# Patient Record
Sex: Male | Born: 1950 | Race: White | Hispanic: No | Marital: Married | Smoking: Never smoker
Health system: Southern US, Community
[De-identification: ages and names within clinical notes are randomized; demographics above are authoritative.]

## PROBLEM LIST (undated history)

## (undated) DIAGNOSIS — Z9889 Other specified postprocedural states: Secondary | ICD-10-CM

## (undated) DIAGNOSIS — N529 Male erectile dysfunction, unspecified: Secondary | ICD-10-CM

## (undated) DIAGNOSIS — I4891 Unspecified atrial fibrillation: Secondary | ICD-10-CM

## (undated) HISTORY — DX: Other specified postprocedural states: Z98.890

## (undated) HISTORY — DX: Unspecified atrial fibrillation: I48.91

## (undated) HISTORY — DX: Male erectile dysfunction, unspecified: N52.9

---

## 2006-10-09 HISTORY — PX: COLONOSCOPY: SHX174

## 2011-06-09 DIAGNOSIS — Z9889 Other specified postprocedural states: Secondary | ICD-10-CM

## 2011-06-09 HISTORY — DX: Other specified postprocedural states: Z98.890

## 2014-12-09 ENCOUNTER — Ambulatory Visit: Payer: Self-pay | Admitting: Internal Medicine

## 2014-12-15 ENCOUNTER — Ambulatory Visit (INDEPENDENT_AMBULATORY_CARE_PROVIDER_SITE_OTHER): Payer: BC Managed Care – PPO | Admitting: Internal Medicine

## 2014-12-15 ENCOUNTER — Encounter: Payer: Self-pay | Admitting: Internal Medicine

## 2014-12-15 VITALS — BP 122/84 | HR 84 | Ht 76.0 in | Wt 204.4 lb

## 2014-12-15 DIAGNOSIS — I712 Thoracic aortic aneurysm, without rupture: Secondary | ICD-10-CM

## 2014-12-15 DIAGNOSIS — I482 Chronic atrial fibrillation, unspecified: Secondary | ICD-10-CM

## 2014-12-15 DIAGNOSIS — I480 Paroxysmal atrial fibrillation: Secondary | ICD-10-CM

## 2014-12-15 DIAGNOSIS — I4891 Unspecified atrial fibrillation: Secondary | ICD-10-CM | POA: Insufficient documentation

## 2014-12-15 DIAGNOSIS — I719 Aortic aneurysm of unspecified site, without rupture: Secondary | ICD-10-CM

## 2014-12-15 DIAGNOSIS — I119 Hypertensive heart disease without heart failure: Secondary | ICD-10-CM | POA: Diagnosis not present

## 2014-12-15 DIAGNOSIS — I7121 Aneurysm of the ascending aorta, without rupture: Secondary | ICD-10-CM

## 2014-12-15 NOTE — Patient Instructions (Addendum)
Medication Instructions:  Your physician recommends that you continue on your current medications as directed. Please refer to the Current Medication list given to you today.  Labwork: None ordered  Testing/Procedures: Your physician has requested that you have a cardiac MRI. Cardiac MRI uses a computer to create images of your heart as its beating, producing both still and moving pictures of your heart and major blood vessels. For further information please visit [email or web address]. Please follow the instruction sheet given to you today for more information.  Follow-Up: Your physician recommends that you schedule a follow-up appointment in: 8 weeks with Dr. Ladona Ridgelaylor.  Any Other Special Instructions Will Be Listed Below (If Applicable). We will obtain medical records from Dr. Geraldine ContrasNazim Khan at [facility] in [place], [place].  If you need a refill on your cardiac medications before your next appointment, please call your pharmacy.  Thank you for choosing [facility]!!

## 2014-12-15 NOTE — Assessment & Plan Note (Signed)
We discussed the treatment options with regard to thomboembolic prevention and rate vs rhythm control. Because he is mininimally if at all symptomatic and because of the difficulty in achieving rhythm control, I have recommended he continue his medications. If he does not have vascular disease on the MRI, then his CHADSVASC score would be 1 and we could even consider stopping Xarelto and switching to ASA, at least until he turns 66. I will see him back after he has undergone his Cardiac/Aorta MR

## 2014-12-15 NOTE — Progress Notes (Signed)
      HPI [name] is referred today for evaluation of atrial fibrillation. He is an otherwise well appearing 64 yo man with borderline HTN, who was found to have atrial fib back in September. He was placed on Xarelto and a beta blocker and underwent TEE where there was a question of LAA thrombus despite his being on Xarelto for a month and having a CHADSVASC score of 1. His dose of Toprol has been increased to 100 mg daily. He has been essentially asymptomatic. On his TEE, there was a question of aortic root dilation. His measurement was 4 cm. He is quite concerned about whether or not he has aneurysm. No h/o syncope.  No Known Allergies   Current Outpatient Prescriptions  Medication Sig Dispense Refill  . acetaminophen (TYLENOL) 500 MG tablet Take 500 mg by mouth every 6 (six) hours as needed (for pain).    Marland Kitchen. doxycycline (VIBRAMYCIN) 100 MG capsule Take 100 mg by mouth 2 (two) times daily as needed (for rosacea).    . loratadine (CLARITIN) 10 MG tablet Take 10 mg by mouth daily.    . metoprolol succinate (TOPROL-XL) 100 MG 24 hr tablet Take 100 mg by mouth daily.  3  . Omega-3 Fatty Acids (FISH OIL) 1200 MG CAPS Take 1 capsule by mouth daily.    . phenylephrine (SUDAFED PE) 10 MG TABS tablet Take 10 mg by mouth every 4 (four) hours as needed (sinus congestion).    . Red Yeast Rice 600 MG CAPS Take 1 capsule by mouth daily.    Carlena Hurl. XARELTO 20 MG TABS tablet Take 20 mg by mouth daily.  5   No current facility-administered medications for this visit.     Past Medical History  Diagnosis Date  . A-fib (HCC)     ROS:   All systems reviewed and negative except as noted in the HPI.   No past surgical history on file.   Family History  Problem Relation Age of Onset  . Diabetes Father   . Cancer Father      Social History   Social History  . Marital Status: Married    Spouse Name: N/A  . Number of Children: N/A  . Years of Education: N/A   Occupational History  . Not on  file.   Social History Main Topics  . Smoking status: Never Smoker   . Smokeless tobacco: Not on file  . Alcohol Use: 0.0 oz/week    0 Standard drinks or equivalent per week  . Drug Use: No  . Sexual Activity: Not on file   Other Topics Concern  . Not on file   Social History Narrative  . No narrative on file     BP 122/84 mmHg  Pulse 84  Ht 6\' 4"  (1.93 m)  Wt 204 lb 6.4 oz (92.715 kg)  BMI 24.89 kg/m2  Physical Exam:  Well appearing 64 yo man, NAD HEENT: Unremarkable Neck:  6 cm JVD, no thyromegally Lymphatics:  No adenopathy Back:  No CVA tenderness Lungs:  Clear HEART:  IRIR rhythm, no murmurs, no rubs, no clicks Abd:  soft, positive bowel sounds, no organomegally, no rebound, no guarding Ext:  2 plus pulses, no edema, no cyanosis, no clubbing Skin:  No rashes no nodules Neuro:  CN II through XII intact, motor grossly intact  EKG - atrial fib with a CVR  Assess/Plan:

## 2014-12-15 NOTE — Assessment & Plan Note (Signed)
His blood pressure is well controlled on medical therapy.

## 2014-12-15 NOTE — Assessment & Plan Note (Signed)
Will obtai nan MRI

## 2014-12-20 ENCOUNTER — Encounter: Payer: Self-pay | Admitting: Internal Medicine

## 2015-01-05 ENCOUNTER — Ambulatory Visit (HOSPITAL_COMMUNITY)
Admission: RE | Admit: 2015-01-05 | Discharge: 2015-01-05 | Disposition: A | Discharge status: Home / Self Care | Payer: BC Managed Care – PPO | Source: Ambulatory Visit | Attending: Internal Medicine | Admitting: Internal Medicine

## 2015-01-05 DIAGNOSIS — I712 Thoracic aortic aneurysm, without rupture: Secondary | ICD-10-CM

## 2015-01-05 DIAGNOSIS — I77810 Thoracic aortic ectasia: Secondary | ICD-10-CM | POA: Diagnosis not present

## 2015-01-05 DIAGNOSIS — I4891 Unspecified atrial fibrillation: Secondary | ICD-10-CM | POA: Diagnosis not present

## 2015-01-05 DIAGNOSIS — I7121 Aneurysm of the ascending aorta, without rupture: Secondary | ICD-10-CM

## 2015-01-05 DIAGNOSIS — I719 Aortic aneurysm of unspecified site, without rupture: Secondary | ICD-10-CM | POA: Insufficient documentation

## 2015-01-05 LAB — CREATININE, SERUM: Creatinine, Ser: 0.91 mg/dL (ref 0.61–1.24)

## 2015-01-05 MED ORDER — GADOBENATE DIMEGLUMINE 529 MG/ML IV SOLN
40.0000 mL | Freq: Once | INTRAVENOUS | Status: AC
  Administered 2015-01-05: 40 mL via INTRAVENOUS

## 2015-01-05 NOTE — Addendum Note (Signed)
Addended by: Lars MassonNELSON, Koree Staheli H on: 01/05/2015 12:02 PM   Modules accepted: Orders

## 2015-01-05 NOTE — Addendum Note (Signed)
Addended by: Lars MassonNELSON, Gabrial Poppell H on: 01/05/2015 11:52 AM   Modules accepted: Orders

## 2015-01-27 ENCOUNTER — Encounter: Payer: Self-pay | Admitting: Internal Medicine

## 2015-02-09 ENCOUNTER — Encounter: Payer: Self-pay | Admitting: Internal Medicine

## 2015-02-09 ENCOUNTER — Ambulatory Visit (INDEPENDENT_AMBULATORY_CARE_PROVIDER_SITE_OTHER): Payer: BC Managed Care – PPO | Admitting: Internal Medicine

## 2015-02-09 VITALS — BP 116/76 | HR 79 | Ht 76.0 in | Wt 207.4 lb

## 2015-02-09 DIAGNOSIS — I482 Chronic atrial fibrillation, unspecified: Secondary | ICD-10-CM

## 2015-02-09 NOTE — Progress Notes (Signed)
HPI Michael Sheppard returns today for evaluation of atrial fibrillation and an aortic aneurysm. He is an otherwise well appearing 65 yo man with borderline HTN, who was found to have atrial fib back in September. He was placed on Xarelto and a beta blocker and underwent TEE where there was a question of LAA thrombus despite his being on Xarelto for a month and having a CHADSVASC score of 1. His dose of Toprol has been increased to 100 mg daily. He has been essentially asymptomatic. On his TEE, there was a question of aortic root dilation. When I saw him we recommended a cardiac MRI with MRI of the thoracic aorta. He was found to have an aortic root diameter of 47 mm and an ascending aorta of 40 mm. His LV function was normal. The recommendation was to repeat the scan in a year.  No Known Allergies   Current Outpatient Prescriptions  Medication Sig Dispense Refill  . acetaminophen (TYLENOL) 500 MG tablet Take 500 mg by mouth every 6 (six) hours as needed (for pain).    Marland Kitchen doxycycline (VIBRAMYCIN) 100 MG capsule Take 100 mg by mouth 2 (two) times daily as needed (for rosacea).    . loratadine (CLARITIN) 10 MG tablet Take 10 mg by mouth daily.    . metoprolol succinate (TOPROL-XL) 100 MG 24 hr tablet Take 100 mg by mouth daily.  3  . Omega-3 Fatty Acids (FISH OIL) 1200 MG CAPS Take 1 capsule by mouth daily.    . phenylephrine (SUDAFED PE) 10 MG TABS tablet Take 10 mg by mouth every 4 (four) hours as needed (sinus congestion).    . Red Yeast Rice 600 MG CAPS Take 1 capsule by mouth daily.    . tadalafil (CIALIS) 5 MG tablet Take 5 mg by mouth daily as needed for erectile dysfunction.    Carlena Hurl 20 MG TABS tablet Take 20 mg by mouth daily.  5   No current facility-administered medications for this visit.     Past Medical History  Diagnosis Date  . A-fib (HCC)   . Erectile dysfunction   . H/O colonoscopy 06/2011    ROS:   All systems reviewed and negative except as noted in the  HPI.   Past Surgical History  Procedure Laterality Date  . Colonoscopy  10/2006    POLYP     Family History  Problem Relation Age of Onset  . Diabetes Father   . Cancer Father      Social History   Social History  . Marital Status: Married    Spouse Name: N/A  . Number of Children: N/A  . Years of Education: N/A   Occupational History  . Not on file.   Social History Main Topics  . Smoking status: Never Smoker   . Smokeless tobacco: Not on file  . Alcohol Use: 0.0 oz/week    0 Standard drinks or equivalent per week  . Drug Use: No  . Sexual Activity: Not on file   Other Topics Concern  . Not on file   Social History Narrative     BP 116/76 mmHg  Pulse 79  Ht  (1.93 m)  Wt 207 lb 6.4 oz (94.076 kg)  BMI 25.26 kg/m2  Physical Exam:  Well appearing 65 yo man, NAD HEENT: Unremarkable Neck:  6 cm JVD, no thyromegally Lymphatics:  No adenopathy Back:  No CVA tenderness Lungs:  Clear with no wheezes HEART:  IRIR rhythm, no murmurs,  no rubs, no clicks Abd:  soft, positive bowel sounds, no organomegally, no rebound, no guarding Ext:  2 plus pulses, no edema, no cyanosis, no clubbing Skin:  No rashes no nodules Neuro:  CN II through XII intact, motor grossly intact  EKG - atrial fib with a CVR  Assess/Plan: 1. Atrial fib - his rate is well controlled. With vascular disease and HTN, will continue systemic anti-coagulation 2. Aortic aneurysm - measurements have been documented. He will undergo watchful waiting and we will repeat his scan in a year. He is instructed to call if he has chest pain. 3. HTN - his blood pressure is well controlled. Will follow.  4. Palpitations - he is mostly asymptomatic although he noted some palpitations when he was in the scanner.

## 2015-02-09 NOTE — Patient Instructions (Signed)

## 2015-12-28 ENCOUNTER — Encounter: Payer: Self-pay | Admitting: Internal Medicine

## 2016-02-08 ENCOUNTER — Encounter: Payer: Self-pay | Admitting: *Deleted

## 2016-02-13 ENCOUNTER — Ambulatory Visit (INDEPENDENT_AMBULATORY_CARE_PROVIDER_SITE_OTHER): Payer: BC Managed Care – PPO | Admitting: Internal Medicine

## 2016-02-13 ENCOUNTER — Encounter: Payer: Self-pay | Admitting: Internal Medicine

## 2016-02-13 VITALS — BP 120/84 | HR 74 | Ht 76.0 in | Wt 208.4 lb

## 2016-02-13 DIAGNOSIS — I7121 Aneurysm of the ascending aorta, without rupture: Secondary | ICD-10-CM

## 2016-02-13 DIAGNOSIS — I482 Chronic atrial fibrillation, unspecified: Secondary | ICD-10-CM

## 2016-02-13 DIAGNOSIS — I712 Thoracic aortic aneurysm, without rupture: Secondary | ICD-10-CM | POA: Diagnosis not present

## 2016-02-13 NOTE — Patient Instructions (Addendum)
Medication Instructions:  Your physician recommends that you continue on your current medications as directed. Please refer to the Current Medication list given to you today.   Labwork: None Ordered   Testing/Procedures: MRA    Follow-Up: Your physician wants you to follow-up in: 1 year with Dr. Ladona Ridgelaylor. You will receive a reminder letter in the mail two months in advance. If you don't receive a letter, please call our office to schedule the follow-up appointment.     Any Other Special Instructions Will Be Listed Below (If Applicable).     If you need a refill on your cardiac medications before your next appointment, please call your pharmacy.

## 2016-02-13 NOTE — Progress Notes (Signed)
HPI [name] returns today for ongoing evaluation of atrial fibrillation and an aortic aneurysm. He is an otherwise well appearing 66 yo man with borderline HTN, who was found to have atrial fib back in September. He was placed on Xarelto and a beta blocker and underwent TEE where there was a question of LAA thrombus despite his being on Xarelto for a month and having a CHADSVASC score of 1. His dose of Toprol has been increased to 100 mg daily. He has been essentially asymptomatic. On his TEE, there was a question of aortic root dilation. When I saw him we recommended a cardiac MRI with MRI of the thoracic aorta. He was found to have an aortic root diameter of 47 mm and an ascending aorta of 40 mm. His LV function was normal. The recommendation was to repeat the scan in a year. In the interim, he has done well. He denies chest pain.  No Known Allergies   Current Outpatient Prescriptions  Medication Sig Dispense Refill  . acetaminophen (TYLENOL) 500 MG tablet Take 500 mg by mouth every 6 (six) hours as needed (for pain).    Marland Kitchen. doxycycline (VIBRAMYCIN) 100 MG capsule Take 100 mg by mouth 2 (two) times daily as needed (for rosacea).    . loratadine (CLARITIN) 10 MG tablet Take 10 mg by mouth daily.    . metoprolol succinate (TOPROL-XL) 100 MG 24 hr tablet Take 100 mg by mouth daily.  3  . Omega-3 Fatty Acids (FISH OIL) 1200 MG CAPS Take 1 capsule by mouth daily.    . phenylephrine (SUDAFED PE) 10 MG TABS tablet Take 10 mg by mouth every 4 (four) hours as needed (sinus congestion).    . Red Yeast Rice 600 MG CAPS Take 1 capsule by mouth daily.    . tadalafil (CIALIS) 5 MG tablet Take 5 mg by mouth daily as needed for erectile dysfunction.    Michael Sheppard. XARELTO 20 MG TABS tablet Take 20 mg by mouth daily.  5   No current facility-administered medications for this visit.      Past Medical History:  Diagnosis Date  . A-fib (HCC)   . Erectile dysfunction   . H/O colonoscopy 06/2011    ROS:   All systems reviewed and negative except as noted in the HPI.   Past Surgical History:  Procedure Laterality Date  . COLONOSCOPY  10/2006   POLYP     Family History  Problem Relation Age of Onset  . Diabetes Father   . Cancer Father      Social History   Social History  . Marital status: Married    Spouse name: N/A  . Number of children: N/A  . Years of education: N/A   Occupational History  . Not on file.   Social History Main Topics  . Smoking status: Never Smoker  . Smokeless tobacco: Never Used  . Alcohol use 0.0 oz/week  . Drug use: No  . Sexual activity: Not on file   Other Topics Concern  . Not on file   Social History Narrative  . No narrative on file     BP 120/84   Pulse 74   Ht 6\' 4"  (1.93 m)   Wt 208 lb 6.4 oz (94.5 kg)   SpO2 98%   BMI 25.37 kg/m   Physical Exam:  Well appearing 66 yo man, NAD HEENT: Unremarkable Neck:  6 cm JVD, no thyromegally Lymphatics:  No adenopathy Back:  No CVA  tenderness Lungs:  Clear with no wheezes HEART:  IRIR rhythm, no murmurs, no rubs, no clicks Abd:  soft, positive bowel sounds, no organomegally, no rebound, no guarding Ext:  2 plus pulses, no edema, no cyanosis, no clubbing Skin:  No rashes no nodules Neuro:  CN II through XII intact, motor grossly intact  EKG - atrial fib with a CVR  Assess/Plan: 1. Atrial fib - his rate is well controlled. With vascular disease and HTN, will continue systemic anti-coagulation 2. Aortic aneurysm - will recheck his MRI of the chest to look at his aortic root.  He is instructed to call if he has chest pain. 3. HTN - his blood pressure is well controlled. Will follow.  4. coags - he will continue his xarelto  Michael Sheppard.D.

## 2016-02-16 ENCOUNTER — Encounter: Payer: Self-pay | Admitting: Internal Medicine

## 2016-02-21 ENCOUNTER — Ambulatory Visit (HOSPITAL_COMMUNITY)
Admission: RE | Admit: 2016-02-21 | Discharge: 2016-02-21 | Disposition: A | Discharge status: Home / Self Care | Payer: BC Managed Care – PPO | Source: Ambulatory Visit | Attending: Internal Medicine | Admitting: Internal Medicine

## 2016-02-21 DIAGNOSIS — K7689 Other specified diseases of liver: Secondary | ICD-10-CM | POA: Insufficient documentation

## 2016-02-21 DIAGNOSIS — I712 Thoracic aortic aneurysm, without rupture: Secondary | ICD-10-CM | POA: Insufficient documentation

## 2016-02-21 DIAGNOSIS — N281 Cyst of kidney, acquired: Secondary | ICD-10-CM | POA: Insufficient documentation

## 2016-02-21 DIAGNOSIS — I7121 Aneurysm of the ascending aorta, without rupture: Secondary | ICD-10-CM

## 2016-02-21 LAB — CREATININE, SERUM
Creatinine, Ser: 0.91 mg/dL (ref 0.61–1.24)
GFR calc Af Amer: >60 mL/min (ref >60)
GFR calc non Af Amer: >60 mL/min (ref >60)

## 2016-02-21 MED ORDER — GADOBENATE DIMEGLUMINE 529 MG/ML IV SOLN
20.0000 mL | Freq: Once | INTRAVENOUS | Status: AC | PRN
  Administered 2016-02-21: 20 mL via INTRAVENOUS

## 2016-07-23 IMAGING — MR MR MRA CHEST W/ OR W/O CM
14 series · 16 of 16 positions shown · IV contrast (Yes   MULTIHANCE)
Comparison: none

CLINICAL DATA: 64-year-old male with atrial fibrillation and
ascending aortic aneurysm.

EXAM:
CARDIAC MRI
TECHNIQUE: The patient was scanned on a 1.5 Tesla GE magnet. A dedicated
cardiac coil was used. Functional imaging was done using Fiesta
sequences. [DATE], and 4 chamber views were done to assess for RWMA's.
Modified Dessai rule using a short axis stack was used to
calculate an ejection fraction on a dedicated work station using
Circle software. The patient received 40 cc of Multihance. After 10
minutes inversion recovery sequences were used to assess for
infiltration and scar tissue.
CONTRAST:  40 cc  of Multihance

[Series 3: bSSFP · sagittal · 8.0mm · 1.45mm/px · 1 of 14 slices shown (1 of 4)]
[im 1/14]
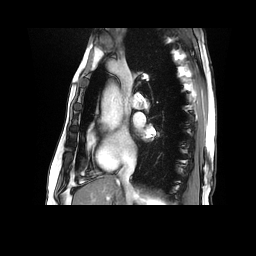

[Series 4: bSSFP · oblique · 8.0mm · 1.64mm/px · 1 of 20 slices shown (2 of 4)]
[im 1/20]
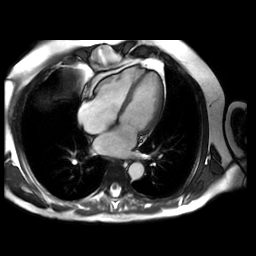

[Series 5: bSSFP · oblique · 8.0mm · 1.68mm/px · 3 of 340 slices shown (3 of 4)]
[im 1/340]
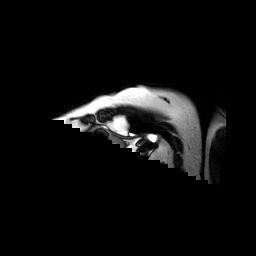
[im 170/340]
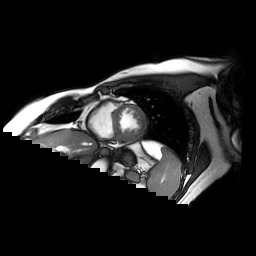
[im 340/340]
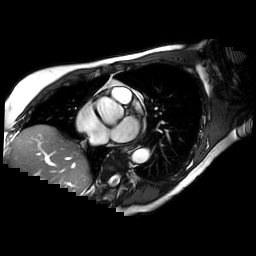

[Series 6: T1 · axial · 8.0mm · 1.56mm/px · 1 of 23 slices shown]
[im 1/23]
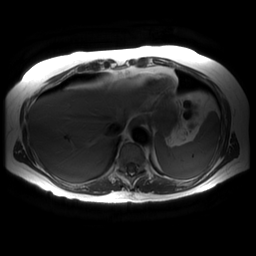

[Series 7: bSSFP · oblique · 8.0mm · 0.82mm/px · 1 of 220 slices shown (4 of 4)]
[im 1/220]
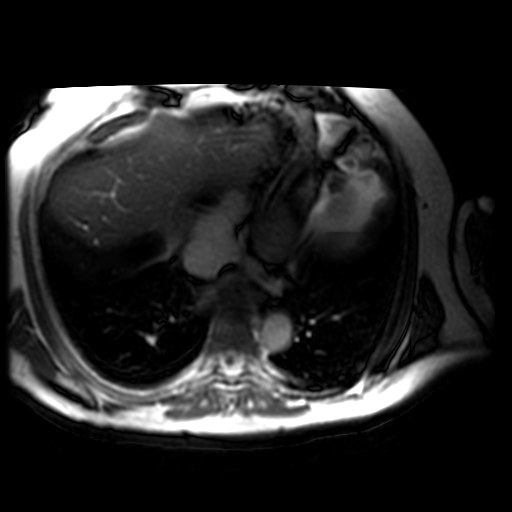

[Series 10: T2 · oblique · 8.0mm · 1.68mm/px · 1 of 60 slices shown]
[im 1/60]
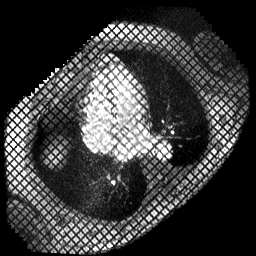

[Series 19: rad delayed ir · oblique · 8.0mm · 1.72mm/px · 1 of 3 slices shown (1 of 2)]
[im 1/3]
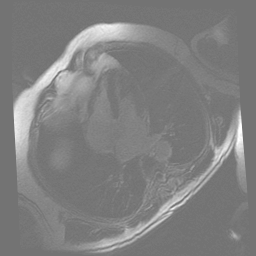

[Series 21: rad delayed ir · oblique · 8.0mm · 1.72mm/px · 1 of 4 slices shown (2 of 2)]
[im 1/4]
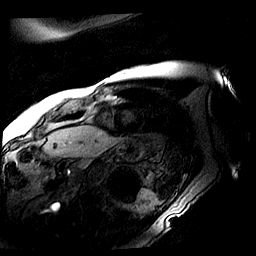

[Series 800: MRA · sagittal · 3.0mm · 0.74mm/px · 1 of 64 slices shown (1 of 3)]
[im 1/64]
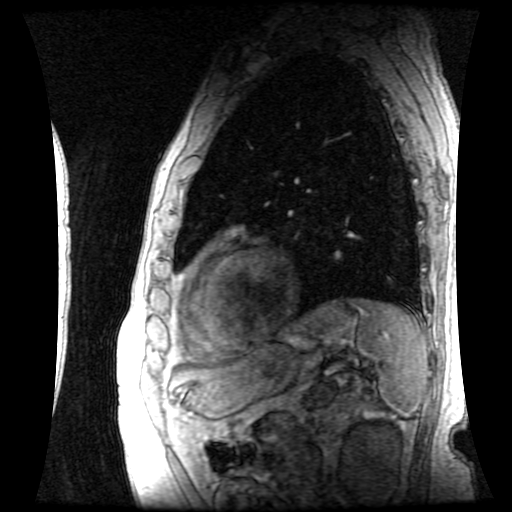

[Series 801: MRA · sagittal · 3.0mm · 0.74mm/px · 1 of 64 slices shown (2 of 3)]
[im 1/64]
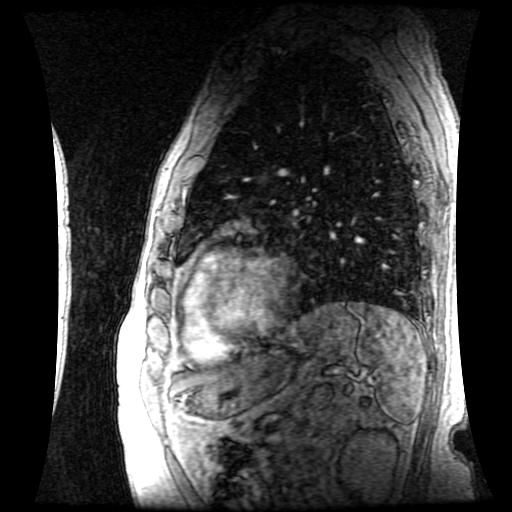

[Series 802: MRA · sagittal · 3.0mm · 0.74mm/px · 1 of 64 slices shown (3 of 3)]
[im 1/64]
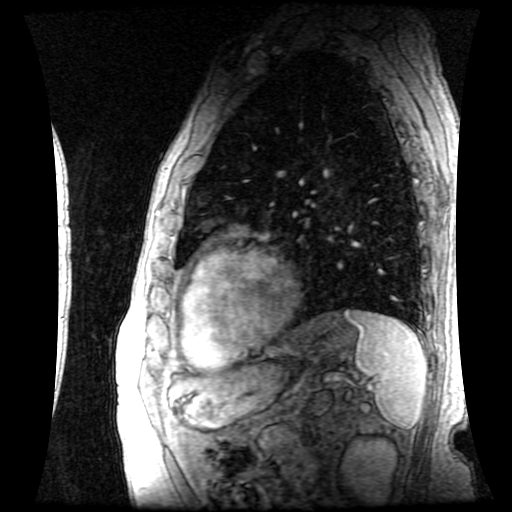

[((id)/801/1)-((id)/800/1) · sagittal · 3.0mm · 0.74mm/px · 1 of 64 slices shown]
[im 1/64]
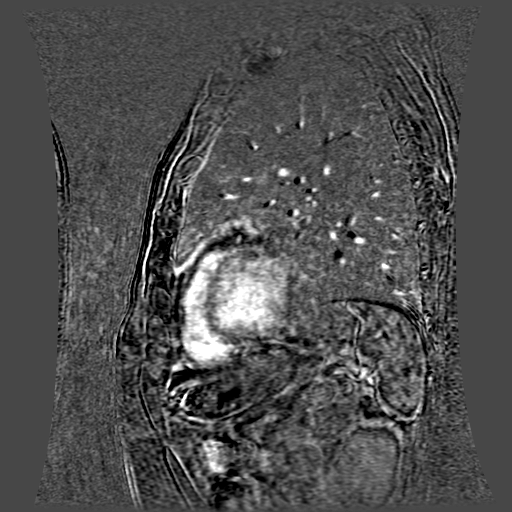

[((id)/802/1)-((id)/800/1) · sagittal · 3.0mm · 0.74mm/px · 1 of 64 slices shown]
[im 1/64]
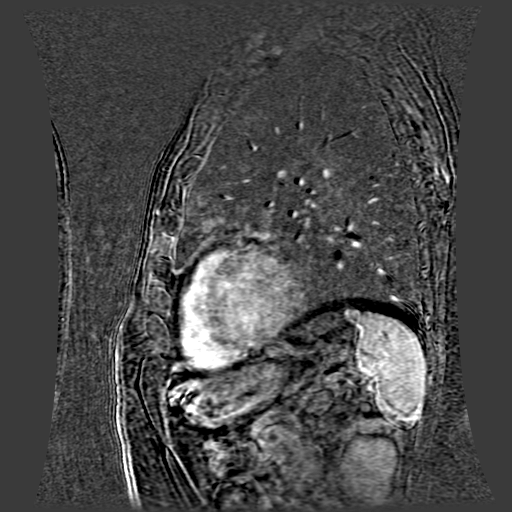

[processed images · sagittal · 3.0mm · 0.74mm/px · 1 of 5 slices shown]
[im 1/5]
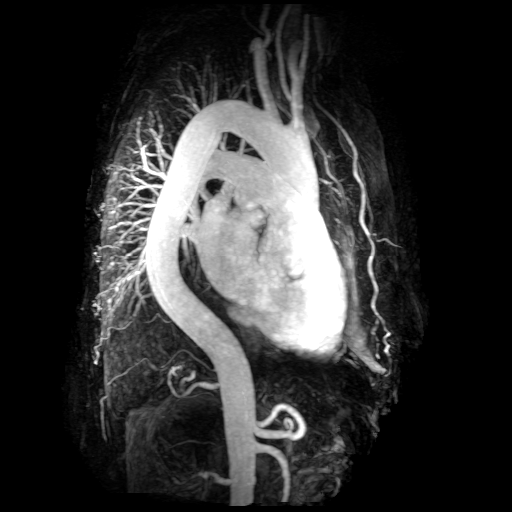

[16 of 16 positions shown; findings below may reference images not displayed]

FINDINGS: 1. Normal left ventricular size, thickness and systolic function
(LVEF = 55%) with no regional wall motion abnormalities.

There is no late gadolinium enhancement in the LV myocardium.

LVEDD:  46 mm

LVESD:  34 mm

LVEDV:  151 ml

LVESV:  68 ml

SV:  83 ml

CO:  6.3 L/minute

2. Normal right ventricular size, thickness and systolic function
(LVEF = 51%) with no regional wall motion abnormalities.

RVEDV:  163 ml

RVESV:  81 ml

SV:  83 ml

CO:  6.3 L/minute

3.  Mild left and right atrial dilatation.

4. Mild mitral and trivial tricuspid regurgitation. Trivial aortic
regurgitation.

5.  Aortic root is dilated with maximum diameter 47 mm.

6. Ascending aorta is aneurysmal measuring 40 mm. Aortic arch and
descending aorta have normal size.
IMPRESSION: 1. Normal left ventricular size, thickness and systolic function
(LVEF = 55%) with no regional wall motion abnormalities.

2. Normal right ventricular size, thickness and systolic function
(LVEF = 51%) with no regional wall motion abnormalities.

3.  Mild left and right atrial dilatation.

4. Mild mitral and trivial tricuspid regurgitation. Trivial aortic
regurgitation.

5. Dilated aortic root with maximum diameter 47 mm.

6. Ascending aorta is aneurysmal measuring 40 mm. Repeat CTA/MRA in
1 year.

Veni Ronnie

## 2017-02-20 ENCOUNTER — Ambulatory Visit: Payer: Medicare Other | Admitting: Internal Medicine

## 2017-02-20 ENCOUNTER — Encounter: Payer: Self-pay | Admitting: Internal Medicine

## 2017-02-20 VITALS — BP 108/74 | HR 86 | Ht 76.0 in | Wt 197.0 lb

## 2017-02-20 DIAGNOSIS — I7121 Aneurysm of the ascending aorta, without rupture: Secondary | ICD-10-CM

## 2017-02-20 DIAGNOSIS — I482 Chronic atrial fibrillation, unspecified: Secondary | ICD-10-CM

## 2017-02-20 DIAGNOSIS — I712 Thoracic aortic aneurysm, without rupture, unspecified: Secondary | ICD-10-CM

## 2017-02-20 NOTE — Patient Instructions (Addendum)
Medication Instructions:  Your physician recommends that you continue on your current medications as directed. Please refer to the Current Medication list given to you today.  Labwork: None ordered.  Testing/Procedures: You will get a MRA of your chest.    Follow-Up: Your physician wants you to follow-up in: one year with Dr. Ladona Ridgelaylor.   You will receive a reminder letter in the mail two months in advance. If you don't receive a letter, please call our office to schedule the follow-up appointment.  Any Other Special Instructions Will Be Listed Below (If Applicable).  If you need a refill on your cardiac medications before your next appointment, please call your pharmacy.

## 2017-02-20 NOTE — Progress Notes (Signed)
HPI Michael Sheppard returns today for ongoing evaluation of atrial fibrillation and an aortic aneurysm. He is an otherwise well appearing 67 yo man with borderline HTN, who was found to have atrial fib over a year ago. He was placed on Xarelto and a beta blocker and underwent TEE where there was a question of LAA thrombus despite his being on Xarelto for a month and having a CHADSVASC score of 1. His dose of Toprol has been increased to 100 mg daily. He has been essentially asymptomatic. On his TEE, there was a question of aortic root dilation. When I saw him we recommended a cardiac MRI with MRI of the thoracic aorta. He was found to have an aortic root diameter of 47 mm and an ascending aorta of 40 mm. His LV function was normal.  In the interim, he has done well. He denies chest pain. He has rare palpitations when he lies down to sleep at night.  No Known Allergies   Current Outpatient Medications  Medication Sig Dispense Refill  . acetaminophen (TYLENOL) 500 MG tablet Take 500 mg by mouth every 6 (six) hours as needed (for pain).    Marland Kitchen doxycycline (VIBRAMYCIN) 100 MG capsule Take 100 mg by mouth 2 (two) times daily as needed (for rosacea).    . loratadine (CLARITIN) 10 MG tablet Take 10 mg by mouth daily.    . metoprolol succinate (TOPROL-XL) 100 MG 24 hr tablet Take 100 mg by mouth daily.  3  . Omega-3 Fatty Acids (FISH OIL) 1200 MG CAPS Take 1 capsule by mouth daily.    . phenylephrine (SUDAFED PE) 10 MG TABS tablet Take 10 mg by mouth every 4 (four) hours as needed (sinus congestion).    . Red Yeast Rice 600 MG CAPS Take 1 capsule by mouth daily.    . tadalafil (CIALIS) 5 MG tablet Take 5 mg by mouth daily as needed for erectile dysfunction.    . rivaroxaban (XARELTO) 20 MG TABS tablet Xarelto 20 mg tablet  TK 1 T PO ONCE D WF     No current facility-administered medications for this visit.      Past Medical History:  Diagnosis Date  . A-fib (HCC)   . Erectile dysfunction   . H/O  colonoscopy 06/2011    ROS:   All systems reviewed and negative except as noted in the HPI.   Past Surgical History:  Procedure Laterality Date  . COLONOSCOPY  10/2006   POLYP     Family History  Problem Relation Age of Onset  . Diabetes Father   . Cancer Father      Social History   Socioeconomic History  . Marital status: Married    Spouse name: Not on file  . Number of children: Not on file  . Years of education: Not on file  . Highest education level: Not on file  Social Needs  . Financial resource strain: Not on file  . Food insecurity - worry: Not on file  . Food insecurity - inability: Not on file  . Transportation needs - medical: Not on file  . Transportation needs - non-medical: Not on file  Occupational History  . Not on file  Tobacco Use  . Smoking status: Never Smoker  . Smokeless tobacco: Never Used  Substance and Sexual Activity  . Alcohol use: Yes    Alcohol/week: 0.0 oz  . Drug use: No  . Sexual activity: Not on file  Other Topics Concern  .  Not on file  Social History Narrative  . Not on file     BP 108/74   Pulse 86   Ht 6\' 4"  (1.93 m)   Wt 197 lb (89.4 kg)   BMI 23.98 kg/m   Physical Exam:  Well appearing 67 yo man, NAD HEENT: Unremarkable Neck:  No JVD, no thyromegally Lymphatics:  No adenopathy Back:  No CVA tenderness Lungs:  Clear HEART:  IRegular rate rhythm, no murmurs, no rubs, no clicks Abd:  soft, positive bowel sounds, no organomegally, no rebound, no guarding Ext:  2 plus pulses, no edema, no cyanosis, no clubbing Skin:  No rashes no nodules Neuro:  CN II through XII intact, motor grossly intact  EKG - atrial fib with a controlled VR   Assess/Plan: 1. Persistent atrial fib - we again discussed rate vs rhythm control. He would like to continue with rate control. 2. HTN - his blood pressure is well controlled. Will follow 3. coags - he will continue Xarelto. No change in meds. 4. Ascending aneurysm - we will  schedule repeat MRI scan over the course of the next week or two. Surgical consult if/when over 5 cm.  Leonia ReevesGregg Newman Waren,M.D.

## 2017-03-04 ENCOUNTER — Ambulatory Visit (HOSPITAL_COMMUNITY)
Admission: RE | Admit: 2017-03-04 | Discharge: 2017-03-04 | Disposition: A | Discharge status: Home / Self Care | Payer: Medicare Other | Source: Ambulatory Visit | Attending: Internal Medicine | Admitting: Internal Medicine

## 2017-03-04 DIAGNOSIS — I712 Thoracic aortic aneurysm, without rupture, unspecified: Secondary | ICD-10-CM

## 2017-03-04 LAB — POCT I-STAT CREATININE: Creatinine, Ser: 0.8 mg/dL (ref 0.61–1.24)

## 2017-03-04 MED ORDER — GADOBENATE DIMEGLUMINE 529 MG/ML IV SOLN
20.0000 mL | Freq: Once | INTRAVENOUS | Status: AC
  Administered 2017-03-04: 20 mL via INTRAVENOUS

## 2018-02-26 ENCOUNTER — Encounter: Payer: Self-pay | Admitting: Internal Medicine

## 2018-02-26 ENCOUNTER — Ambulatory Visit: Payer: Medicare Other | Admitting: Internal Medicine

## 2018-02-26 VITALS — BP 124/70 | HR 82 | Ht 76.0 in | Wt 200.0 lb

## 2018-02-26 DIAGNOSIS — I10 Essential (primary) hypertension: Secondary | ICD-10-CM

## 2018-02-26 DIAGNOSIS — I7121 Aneurysm of the ascending aorta, without rupture: Secondary | ICD-10-CM

## 2018-02-26 DIAGNOSIS — I712 Thoracic aortic aneurysm, without rupture, unspecified: Secondary | ICD-10-CM

## 2018-02-26 DIAGNOSIS — I482 Chronic atrial fibrillation, unspecified: Secondary | ICD-10-CM | POA: Diagnosis not present

## 2018-02-26 MED ORDER — ROSUVASTATIN CALCIUM 10 MG PO TABS: 10.0000 mg | ORAL_TABLET | Freq: Every day | ORAL | 3 refills | Status: DC

## 2018-02-26 NOTE — Progress Notes (Signed)
HPI Michael Sheppard returns today for ongoing evaluation and management of his atrial fib and thoracic aneurysm. He has an ascending aortic aneurysm which has been stable. He has had longstanding persistent atrial fib. He has never had syncope. He has been maintained on systemic anti-coagulation. He denies palpitations or sob or other symptoms associated with heart.  No Known Allergies   Current Outpatient Medications  Medication Sig Dispense Refill  . acetaminophen (TYLENOL) 500 MG tablet Take 500 mg by mouth every 6 (six) hours as needed (for pain).    Marland Kitchen doxycycline (VIBRAMYCIN) 100 MG capsule Take 100 mg by mouth 2 (two) times daily as needed (for rosacea).    . loratadine (CLARITIN) 10 MG tablet Take 10 mg by mouth daily.    . metoprolol succinate (TOPROL-XL) 100 MG 24 hr tablet Take 100 mg by mouth daily.  3  . Omega-3 Fatty Acids (FISH OIL) 1200 MG CAPS Take 1 capsule by mouth daily.    . phenylephrine (SUDAFED PE) 10 MG TABS tablet Take 10 mg by mouth every 4 (four) hours as needed (sinus congestion).    . Red Yeast Rice 600 MG CAPS Take 1 capsule by mouth daily.    . rivaroxaban (XARELTO) 20 MG TABS tablet Xarelto 20 mg tablet  TK 1 T PO ONCE D WF    . tadalafil (CIALIS) 5 MG tablet Take 5 mg by mouth daily as needed for erectile dysfunction.    . rosuvastatin (CRESTOR) 10 MG tablet Take 1 tablet (10 mg total) by mouth daily. 90 tablet 3   No current facility-administered medications for this visit.      Past Medical History:  Diagnosis Date  . A-fib (HCC)   . Erectile dysfunction   . H/O colonoscopy 06/2011    ROS:   All systems reviewed and negative except as noted in the HPI.   Past Surgical History:  Procedure Laterality Date  . COLONOSCOPY  10/2006   POLYP     Family History  Problem Relation Age of Onset  . Diabetes Father   . Cancer Father      Social History   Socioeconomic History  . Marital status: Married    Spouse name: Not on file  . Number  of children: Not on file  . Years of education: Not on file  . Highest education level: Not on file  Occupational History  . Not on file  Social Needs  . Financial resource strain: Not on file  . Food insecurity:    Worry: Not on file    Inability: Not on file  . Transportation needs:    Medical: Not on file    Non-medical: Not on file  Tobacco Use  . Smoking status: Never Smoker  . Smokeless tobacco: Never Used  Substance and Sexual Activity  . Alcohol use: Yes    Alcohol/week: 0.0 standard drinks  . Drug use: No  . Sexual activity: Not on file  Lifestyle  . Physical activity:    Days per week: Not on file    Minutes per session: Not on file  . Stress: Not on file  Relationships  . Social connections:    Talks on phone: Not on file    Gets together: Not on file    Attends religious service: Not on file    Active member of club or organization: Not on file    Attends meetings of clubs or organizations: Not on file    Relationship status:  Not on file  . Intimate partner violence:    Fear of current or ex partner: Not on file    Emotionally abused: Not on file    Physically abused: Not on file    Forced sexual activity: Not on file  Other Topics Concern  . Not on file  Social History Narrative  . Not on file     BP 124/70   Pulse 82   Ht 6\' 4"  (1.93 m)   Wt 200 lb (90.7 kg)   BMI 24.34 kg/m   Physical Exam:  Well appearing NAD HEENT: Unremarkable Neck:  No JVD, no thyromegally Lymphatics:  No adenopathy Back:  No CVA tenderness Lungs:  Clear with no wheezes HEART:  IRegular rate rhythm, no murmurs, no rubs, no clicks Abd:  soft, positive bowel sounds, no organomegally, no rebound, no guarding Ext:  2 plus pulses, no edema, no cyanosis, no clubbing Skin:  No rashes no nodules Neuro:  CN II through XII intact, motor grossly intact  EKG - atrial fib with a controlled VR  DEVICE  Normal device function.  See PaceArt for details.   Assess/Plan: 1.  Atrial fib - his rates are well controlled. He will continue his current meds. 2. HTN - his blood pressure is controlled. No change in his meds. 3. Aortic aneurysm - he is asymptomatic. He will undergo yearly CT of the chest.  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  Leonia Reeves.D.

## 2018-02-26 NOTE — Patient Instructions (Addendum)
Medication Instructions:  Your physician has recommended you make the following change in your medication:   1.  Start taking Crestor 10 mg-  Take one tablet by mouth daily   Labwork: None ordered.  Testing/Procedures: Please schedule for an MR angiogram chest at [facility].   Follow-Up: Your physician wants you to follow-up in: one year with Dr. Ladona Ridgel.   You will receive a reminder letter in the mail two months in advance. If you don't receive a letter, please call our office to schedule the follow-up appointment.  Any Other Special Instructions Will Be Listed Below (If Applicable).  If you need a refill on your cardiac medications before your next appointment, please call your pharmacy.    Rosuvastatin Tablets What is this medicine? ROSUVASTATIN (roe SOO va sta tin) is known as a HMG-CoA reductase inhibitor or 'statin'. It lowers cholesterol and triglycerides in the blood. This drug may also reduce the risk of heart attack, stroke, or other health problems in patients with risk factors for heart disease. Diet and lifestyle changes are often used with this drug. This medicine may be used for other purposes; ask your health care provider or pharmacist if you have questions. COMMON BRAND NAME(S): Crestor What should I tell my health care provider before I take this medicine? They need to know if you have any of these conditions: -diabetes -if you often drink alcohol -history of stroke -kidney disease -liver disease -muscle aches or weakness -thyroid disease -an unusual or allergic reaction to rosuvastatin, other medicines, foods, dyes, or preservatives -pregnant or trying to get pregnant -breast-feeding How should I use this medicine? Take this medicine by mouth with a glass of water. Follow the directions on the prescription label. Do not cut, crush or chew this medicine. You can take this medicine with or without food. Take your doses at regular intervals. Do not  take your medicine more often than directed. Talk to your pediatrician regarding the use of this medicine in children. While this drug may be prescribed for children as young as 37 years old for selected conditions, precautions do apply. Overdosage: If you think you have taken too much of this medicine contact a poison control center or emergency room at once. NOTE: This medicine is only for you. Do not share this medicine with others. What if I miss a dose? If you miss a dose, take it as soon as you can. If your next dose is to be taken in less than 12 hours, then do not take the missed dose. Take the next dose at your regular time. Do not take double or extra doses. What may interact with this medicine? Do not take this medicine with any of the following medications: -herbal medicines like red yeast rice This medicine may also interact with the following medications: -alcohol -antacids containing aluminum hydroxide or magnesium hydroxide -cyclosporine -other medicines for high cholesterol -some medicines for HIV infection -warfarin This list may not describe all possible interactions. Give your health care provider a list of all the medicines, herbs, non-prescription drugs, or dietary supplements you use. Also tell them if you smoke, drink alcohol, or use illegal drugs. Some items may interact with your medicine. What should I watch for while using this medicine? Visit your doctor or health care professional for regular check-ups. You may need regular tests to make sure your liver is working properly. Your health care professional may tell you to stop taking this medicine if you develop muscle problems. If your  muscle problems do not go away after stopping this medicine, contact your health care professional. Do not become pregnant while taking this medicine. Women should inform their health care professional if they wish to become pregnant or think they might be pregnant. There is a potential  for serious side effects to an unborn child. Talk to your health care professional or pharmacist for more information. Do not breast-feed an infant while taking this medicine. This medicine may affect blood sugar levels. If you have diabetes, check with your doctor or health care professional before you change your diet or the dose of your diabetic medicine. If you are going to need surgery or other procedure, tell your doctor that you are using this medicine. This drug is only part of a total heart-health program. Your doctor or a dietician can suggest a low-cholesterol and low-fat diet to help. Avoid alcohol and smoking, and keep a proper exercise schedule. This medicine may cause a decrease in Co-Enzyme Q-10. You should make sure that you get enough Co-Enzyme Q-10 while you are taking this medicine. Discuss the foods you eat and the vitamins you take with your health care professional. What side effects may I notice from receiving this medicine? Side effects that you should report to your doctor or health care professional as soon as possible: -allergic reactions like skin rash, itching or hives, swelling of the face, lips, or tongue -dark urine -fever -joint pain -muscle cramps, pain -redness, blistering, peeling or loosening of the skin, including inside the mouth -trouble passing urine or change in the amount of urine -unusually weak or tired -yellowing of the eyes or skin Side effects that usually do not require medical attention (report to your doctor or health care professional if they continue or are bothersome): -constipation -heartburn -nausea -stomach gas, pain, upset This list may not describe all possible side effects. Call your doctor for medical advice about side effects. You may report side effects to FDA at 1-800-FDA-1088. Where should I keep my medicine? Keep out of the reach of children. Store at room temperature between 20 and 25 degrees C (68 and 77 degrees F). Keep  container tightly closed (protect from moisture). Throw away any unused medicine after the expiration date. NOTE: This sheet is a summary. It may not cover all possible information. If you have questions about this medicine, talk to your doctor, pharmacist, or health care provider.  2019 Elsevier/Gold Standard (2016-08-28 12:42:43)

## 2018-03-05 ENCOUNTER — Ambulatory Visit (HOSPITAL_COMMUNITY)
Admission: RE | Admit: 2018-03-05 | Discharge: 2018-03-05 | Disposition: A | Discharge status: Home / Self Care | Payer: Medicare Other | Source: Ambulatory Visit | Attending: Internal Medicine | Admitting: Internal Medicine

## 2018-03-05 DIAGNOSIS — I712 Thoracic aortic aneurysm, without rupture, unspecified: Secondary | ICD-10-CM

## 2018-03-05 LAB — CREATININE, SERUM
CREATININE: 0.92 mg/dL (ref 0.61–1.24)
GFR calc Af Amer: >60 mL/min (ref >60)
GFR calc non Af Amer: >60 mL/min (ref >60)

## 2018-03-05 MED ORDER — GADOBUTROL 1 MMOL/ML IV SOLN
9.0000 mL | Freq: Once | INTRAVENOUS | Status: AC | PRN
  Administered 2018-03-05: 9 mL via INTRAVENOUS

## 2019-02-05 ENCOUNTER — Other Ambulatory Visit: Payer: Self-pay | Admitting: Internal Medicine

## 2019-03-12 ENCOUNTER — Other Ambulatory Visit: Payer: Self-pay

## 2019-03-12 ENCOUNTER — Ambulatory Visit: Payer: Medicare PPO | Admitting: Internal Medicine

## 2019-03-12 ENCOUNTER — Encounter: Payer: Self-pay | Admitting: Internal Medicine

## 2019-03-12 VITALS — BP 144/88 | HR 82 | Ht 76.0 in | Wt 206.0 lb

## 2019-03-12 DIAGNOSIS — I712 Thoracic aortic aneurysm, without rupture: Secondary | ICD-10-CM

## 2019-03-12 DIAGNOSIS — I119 Hypertensive heart disease without heart failure: Secondary | ICD-10-CM | POA: Diagnosis not present

## 2019-03-12 DIAGNOSIS — I4819 Other persistent atrial fibrillation: Secondary | ICD-10-CM | POA: Diagnosis not present

## 2019-03-12 DIAGNOSIS — I7121 Aneurysm of the ascending aorta, without rupture: Secondary | ICD-10-CM

## 2019-03-12 NOTE — Progress Notes (Signed)
HPI [name] returns today for followup. He is a pleasant 69 yo man with a h/o atrial fib, thoracic aneurysm and dyslipidemia. He has done well in the interim. His repeat MRI in 2/20 demonstrated no change in his aneurysm size (40 mm). He feels well. He remains active exercising without limit. No edema. He does not feel his atrial fib.  No Known Allergies   Current Outpatient Medications  Medication Sig Dispense Refill  . acetaminophen (TYLENOL) 500 MG tablet Take 500 mg by mouth every 6 (six) hours as needed (for pain).    Marland Kitchen doxycycline (VIBRAMYCIN) 100 MG capsule Take 100 mg by mouth 2 (two) times daily as needed (for rosacea).    . loratadine (CLARITIN) 10 MG tablet Take 10 mg by mouth daily.    . metoprolol succinate (TOPROL-XL) 100 MG 24 hr tablet Take 100 mg by mouth daily.  3  . Omega-3 Fatty Acids (FISH OIL) 1200 MG CAPS Take 1 capsule by mouth daily.    . phenylephrine (SUDAFED PE) 10 MG TABS tablet Take 10 mg by mouth every 4 (four) hours as needed (sinus congestion).    . rivaroxaban (XARELTO) 20 MG TABS tablet Xarelto 20 mg tablet  TK 1 T PO ONCE D WF    . rosuvastatin (CRESTOR) 10 MG tablet TAKE 1 TABLET(10 MG) BY MOUTH DAILY 90 tablet 0  . tadalafil (CIALIS) 5 MG tablet Take 5 mg by mouth daily as needed for erectile dysfunction.     No current facility-administered medications for this visit.     Past Medical History:  Diagnosis Date  . A-fib (HCC)   . Erectile dysfunction   . H/O colonoscopy 06/2011    ROS:   All systems reviewed and negative except as noted in the HPI.   Past Surgical History:  Procedure Laterality Date  . COLONOSCOPY  10/2006   POLYP     Family History  Problem Relation Age of Onset  . Diabetes Father   . Cancer Father      Social History   Socioeconomic History  . Marital status: Married    Spouse name: Not on file  . Number of children: Not on file  . Years of education: Not on file  . Highest education level: Not on  file  Occupational History  . Not on file  Tobacco Use  . Smoking status: Never Smoker  . Smokeless tobacco: Never Used  Substance and Sexual Activity  . Alcohol use: Yes    Alcohol/week: 0.0 standard drinks  . Drug use: No  . Sexual activity: Not on file  Other Topics Concern  . Not on file  Social History Narrative  . Not on file   Social Determinants of Health   Financial Resource Strain:   . Difficulty of Paying Living Expenses: Not on file  Food Insecurity:   . Worried About Programme researcher, broadcasting/film/video in the Last Year: Not on file  . Ran Out of Food in the Last Year: Not on file  Transportation Needs:   . Lack of Transportation (Medical): Not on file  . Lack of Transportation (Non-Medical): Not on file  Physical Activity:   . Days of Exercise per Week: Not on file  . Minutes of Exercise per Session: Not on file  Stress:   . Feeling of Stress : Not on file  Social Connections:   . Frequency of Communication with Friends and Family: Not on file  . Frequency of Social Gatherings  with Friends and Family: Not on file  . Attends Religious Services: Not on file  . Active Member of Clubs or Organizations: Not on file  . Attends Archivist Meetings: Not on file  . Marital Status: Not on file  Intimate Partner Violence:   . Fear of Current or Ex-Partner: Not on file  . Emotionally Abused: Not on file  . Physically Abused: Not on file  . Sexually Abused: Not on file     BP (!) 144/88   Pulse 82   Ht 6\' 4"  (1.93 m)   Wt 206 lb (93.4 kg)   SpO2 97%   BMI 25.08 kg/m   Physical Exam:  Well appearing NAD HEENT: Unremarkable Neck:  No JVD, no thyromegally Lymphatics:  No adenopathy Back:  No CVA tenderness Lungs:  Clear with no wheezes HEART:  Regular rate rhythm, no murmurs, no rubs, no clicks Abd:  soft, positive bowel sounds, no organomegally, no rebound, no guarding Ext:  2 plus pulses, no edema, no cyanosis, no clubbing Skin:  No rashes no nodules Neuro:   CN II through XII intact, motor grossly intact  EKG - atrial fib with a controlled VR.    Assess/Plan: 1. Atrial fib - his VR is well controlled. He will continue his current meds. 2. Thoracic aneurysm - his MRI a year ago demonstrated no change in size. We will hold off a year and recheck the MRI of his thoracic aneurysm when I see him back in a year. 3. Dyslipidemia - he will continue red yeast rice.  4. Coags - he has not had any bleeding on xarelto. He will continue.  Mikle Bosworth.D.

## 2019-03-12 NOTE — Patient Instructions (Addendum)
Medication Instructions:  Your physician recommends that you continue on your current medications as directed. Please refer to the Current Medication list given to you today.  Labwork: None ordered.  Testing/Procedures:  We will repeat your chest MR in January 2022.  Follow-Up: Your physician wants you to follow-up in: one year with Dr. Ladona Ridgel.   You will receive a reminder letter in the mail two months in advance. If you don't receive a letter, please call our office to schedule the follow-up appointment.   Any Other Special Instructions Will Be Listed Below (If Applicable).  If you need a refill on your cardiac medications before your next appointment, please call your pharmacy.

## 2019-05-07 ENCOUNTER — Other Ambulatory Visit: Payer: Self-pay | Admitting: Internal Medicine

## 2019-12-23 ENCOUNTER — Other Ambulatory Visit: Payer: Self-pay

## 2019-12-23 DIAGNOSIS — I712 Thoracic aortic aneurysm, without rupture, unspecified: Secondary | ICD-10-CM

## 2020-01-18 ENCOUNTER — Ambulatory Visit (HOSPITAL_COMMUNITY)
Admission: RE | Admit: 2020-01-18 | Discharge: 2020-01-18 | Disposition: A | Discharge status: Home / Self Care | Payer: Medicare PPO | Source: Ambulatory Visit | Attending: Internal Medicine | Admitting: Internal Medicine

## 2020-01-18 ENCOUNTER — Other Ambulatory Visit: Payer: Self-pay

## 2020-01-18 DIAGNOSIS — I712 Thoracic aortic aneurysm, without rupture, unspecified: Secondary | ICD-10-CM

## 2020-01-18 MED ORDER — GADOBUTROL 1 MMOL/ML IV SOLN
10.0000 mL | Freq: Once | INTRAVENOUS | Status: AC | PRN
  Administered 2020-01-18: 10 mL via INTRAVENOUS

## 2020-03-22 ENCOUNTER — Ambulatory Visit: Payer: Medicare PPO | Admitting: Internal Medicine

## 2020-03-22 ENCOUNTER — Encounter: Payer: Self-pay | Admitting: Internal Medicine

## 2020-03-22 ENCOUNTER — Other Ambulatory Visit: Payer: Self-pay

## 2020-03-22 VITALS — BP 132/74 | HR 78 | Ht 76.0 in | Wt 206.8 lb

## 2020-03-22 DIAGNOSIS — I119 Hypertensive heart disease without heart failure: Secondary | ICD-10-CM | POA: Diagnosis not present

## 2020-03-22 DIAGNOSIS — I4819 Other persistent atrial fibrillation: Secondary | ICD-10-CM | POA: Diagnosis not present

## 2020-03-22 DIAGNOSIS — I7121 Aneurysm of the ascending aorta, without rupture: Secondary | ICD-10-CM

## 2020-03-22 DIAGNOSIS — I712 Thoracic aortic aneurysm, without rupture: Secondary | ICD-10-CM

## 2020-03-22 NOTE — Patient Instructions (Addendum)
Medication Instructions:  Your physician recommends that you continue on your current medications as directed. Please refer to the Current Medication list given to you today.  Labwork: None ordered.  Testing/Procedures: None ordered.  Follow-Up: Your physician wants you to follow-up in: one year with Gregg Taylor, MD   Any Other Special Instructions Will Be Listed Below (If Applicable).  If you need a refill on your cardiac medications before your next appointment, please call your pharmacy.      

## 2020-03-22 NOTE — Progress Notes (Signed)
HPI [name] returns today for followup. He is a pleasant 70 yo man with a h/o atrial fib, thoracic aneurysm and dyslipidemia. He has done well in the interim. His repeat MRI in 1/22 demonstrated no change in his aneurysm size (40 mm). He feels well. He remains active exercising without limit. No edema. He does not feel his atrial fib. No syncope No Known Allergies   Current Outpatient Medications  Medication Sig Dispense Refill  . acetaminophen (TYLENOL) 500 MG tablet Take 500 mg by mouth every 6 (six) hours as needed (for pain).    Marland Kitchen doxycycline (VIBRAMYCIN) 100 MG capsule Take 100 mg by mouth 2 (two) times daily as needed (for rosacea).    . loratadine (CLARITIN) 10 MG tablet Take 10 mg by mouth daily.    . metoprolol succinate (TOPROL-XL) 100 MG 24 hr tablet Take 100 mg by mouth daily.  3  . Omega-3 Fatty Acids (FISH OIL) 1200 MG CAPS Take 1 capsule by mouth daily.    . phenylephrine (SUDAFED PE) 10 MG TABS tablet Take 10 mg by mouth every 4 (four) hours as needed (sinus congestion).    . rivaroxaban (XARELTO) 20 MG TABS tablet Xarelto 20 mg tablet  TK 1 T PO ONCE D WF    . rosuvastatin (CRESTOR) 10 MG tablet TAKE 1 TABLET(10 MG) BY MOUTH DAILY 90 tablet 3  . tadalafil (CIALIS) 5 MG tablet Take 5 mg by mouth daily as needed for erectile dysfunction.     No current facility-administered medications for this visit.     Past Medical History:  Diagnosis Date  . A-fib (HCC)   . Erectile dysfunction   . H/O colonoscopy 06/2011    ROS:   All systems reviewed and negative except as noted in the HPI.   Past Surgical History:  Procedure Laterality Date  . COLONOSCOPY  10/2006   POLYP     Family History  Problem Relation Age of Onset  . Diabetes Father   . Cancer Father      Social History   Socioeconomic History  . Marital status: Married    Spouse name: Not on file  . Number of children: Not on file  . Years of education: Not on file  . Highest education  level: Not on file  Occupational History  . Not on file  Tobacco Use  . Smoking status: Never Smoker  . Smokeless tobacco: Never Used  Substance and Sexual Activity  . Alcohol use: Yes    Alcohol/week: 0.0 standard drinks  . Drug use: No  . Sexual activity: Not on file  Other Topics Concern  . Not on file  Social History Narrative  . Not on file   Social Determinants of Health   Financial Resource Strain: Not on file  Food Insecurity: Not on file  Transportation Needs: Not on file  Physical Activity: Not on file  Stress: Not on file  Social Connections: Not on file  Intimate Partner Violence: Not on file     BP 132/74   Pulse 78   Ht 6\' 4"  (1.93 m)   Wt 206 lb 12.8 oz (93.8 kg)   SpO2 99%   BMI 25.17 kg/m   Physical Exam:  Well appearing NAD HEENT: Unremarkable Neck:  No JVD, no thyromegally Lymphatics:  No adenopathy Back:  No CVA tenderness Lungs:  Clear with no wheezes HEART:  IRegular rate rhythm, no murmurs, no rubs, no clicks Abd:  soft, positive bowel sounds,  no organomegally, no rebound, no guarding Ext:  2 plus pulses, no edema, no cyanosis, no clubbing Skin:  No rashes no nodules Neuro:  CN II through XII intact, motor grossly intact  EKG - atrial fib with a controlled VR  Assess/Plan: 1. Atrial fib -his VR is well controlled. No change in meds. 2. Ascending aneurysm - his aorta has not changed size in 5 years. We will recheck in a year. 3. Dyslipidemia - he appears to be tolerating his statin therapy 4. Coags - no bleeding on xarelto. We will follow  Michael Taylor,MD 3.

## 2020-05-06 ENCOUNTER — Other Ambulatory Visit: Payer: Self-pay

## 2020-05-06 MED ORDER — ROSUVASTATIN CALCIUM 10 MG PO TABS: ORAL_TABLET | ORAL | 3 refills | Status: AC

## 2020-05-06 NOTE — Telephone Encounter (Signed)
Pt's medication was sent to pt's pharmacy as requested. Confirmation received.  °

## 2021-01-25 ENCOUNTER — Telehealth: Payer: Self-pay

## 2021-01-25 DIAGNOSIS — I7121 Aneurysm of the ascending aorta, without rupture: Secondary | ICD-10-CM

## 2021-01-27 NOTE — Telephone Encounter (Signed)
Order placed for yearly chest CT.

## 2021-02-02 ENCOUNTER — Other Ambulatory Visit: Payer: Self-pay

## 2021-02-02 ENCOUNTER — Other Ambulatory Visit: Payer: Medicare PPO

## 2021-02-02 DIAGNOSIS — I7121 Aneurysm of the ascending aorta, without rupture: Secondary | ICD-10-CM

## 2021-02-02 LAB — BASIC METABOLIC PANEL
BUN/Creatinine Ratio: 16 (ref 10–24)
BUN: 16 mg/dL (ref 8–27)
CO2: 27 mmol/L (ref 20–29)
Calcium: 9.5 mg/dL (ref 8.6–10.2)
Chloride: 103 mmol/L (ref 96–106)
Creatinine, Ser: 1.02 mg/dL (ref 0.76–1.27)
Glucose: 90 mg/dL (ref 70–99)
Potassium: 4.9 mmol/L (ref 3.5–5.2)
Sodium: 141 mmol/L (ref 134–144)
eGFR: 80 mL/min/{1.73_m2} (ref >59)

## 2021-02-09 ENCOUNTER — Ambulatory Visit (INDEPENDENT_AMBULATORY_CARE_PROVIDER_SITE_OTHER)
Admission: RE | Admit: 2021-02-09 | Discharge: 2021-02-09 | Disposition: A | Discharge status: Home / Self Care | Payer: Medicare PPO | Source: Ambulatory Visit | Attending: Internal Medicine | Admitting: Internal Medicine

## 2021-02-09 ENCOUNTER — Other Ambulatory Visit: Payer: Self-pay

## 2021-02-09 DIAGNOSIS — I7121 Aneurysm of the ascending aorta, without rupture: Secondary | ICD-10-CM | POA: Diagnosis not present

## 2021-02-09 MED ORDER — IOHEXOL 350 MG/ML SOLN
100.0000 mL | Freq: Once | INTRAVENOUS | Status: AC | PRN
  Administered 2021-02-09: 100 mL via INTRAVENOUS

## 2021-04-04 ENCOUNTER — Ambulatory Visit: Payer: Medicare PPO | Admitting: Internal Medicine

## 2021-04-04 ENCOUNTER — Encounter: Payer: Self-pay | Admitting: Internal Medicine

## 2021-04-04 ENCOUNTER — Other Ambulatory Visit: Payer: Self-pay

## 2021-04-04 VITALS — BP 136/84 | HR 80 | Ht 76.0 in | Wt 210.2 lb

## 2021-04-04 DIAGNOSIS — I10 Essential (primary) hypertension: Secondary | ICD-10-CM

## 2021-04-04 DIAGNOSIS — I7121 Aneurysm of the ascending aorta, without rupture: Secondary | ICD-10-CM | POA: Diagnosis not present

## 2021-04-04 DIAGNOSIS — I4819 Other persistent atrial fibrillation: Secondary | ICD-10-CM | POA: Diagnosis not present

## 2021-04-04 NOTE — Patient Instructions (Addendum)
Medication Instructions:  Your physician recommends that you continue on your current medications as directed. Please refer to the Current Medication list given to you today.  Labwork: None ordered.  Testing/Procedures: None ordered.  Follow-Up: Your physician wants you to follow-up in: one year with    Michael "Andy" Tillery, PA-C   You will receive a reminder letter in the mail two months in advance. If you don't receive a letter, please call our office to schedule the follow-up appointment.   Any Other Special Instructions Will Be Listed Below (If Applicable).  If you need a refill on your cardiac medications before your next appointment, please call your pharmacy.         

## 2021-04-04 NOTE — Progress Notes (Signed)
? ? ? ? ?HPI ?Mr. Catino returns today for followup. He is a pleasant 71 yo man with a h/o chronic atrial fib, thoracic aneurysm and dyslipidemia. He has done well in the interim. His repeat CT scan in 2/23 demonstrated no change in his aneurysm size (40 mm). He feels well. He remains active exercising without limit. No edema. He does not feel his atrial fib. No syncope ?No Known Allergies ? ? ?Current Outpatient Medications  ?Medication Sig Dispense Refill  ? acetaminophen (TYLENOL) 500 MG tablet Take 500 mg by mouth every 6 (six) hours as needed (for pain).    ? doxycycline (VIBRAMYCIN) 100 MG capsule Take 100 mg by mouth 2 (two) times daily as needed (for rosacea).    ? loratadine (CLARITIN) 10 MG tablet Take 10 mg by mouth daily.    ? metoprolol succinate (TOPROL-XL) 100 MG 24 hr tablet Take 100 mg by mouth daily.  3  ? Omega-3 Fatty Acids (FISH OIL) 1200 MG CAPS Take 1 capsule by mouth daily.    ? phenylephrine (SUDAFED PE) 10 MG TABS tablet Take 10 mg by mouth every 4 (four) hours as needed (sinus congestion).    ? rivaroxaban (XARELTO) 20 MG TABS tablet Xarelto 20 mg tablet ? TK 1 T PO ONCE D WF    ? rosuvastatin (CRESTOR) 10 MG tablet TAKE 1 TABLET(10 MG) BY MOUTH DAILY 90 tablet 3  ? tadalafil (CIALIS) 5 MG tablet Take 5 mg by mouth daily as needed for erectile dysfunction.    ? ?No current facility-administered medications for this visit.  ? ? ? ?Past Medical History:  ?Diagnosis Date  ? A-fib (HCC)   ? Erectile dysfunction   ? H/O colonoscopy 06/2011  ? ? ?ROS: ? ? All systems reviewed and negative except as noted in the HPI. ? ? ?Past Surgical History:  ?Procedure Laterality Date  ? COLONOSCOPY  10/2006  ? POLYP  ? ? ? ?Family History  ?Problem Relation Age of Onset  ? Diabetes Father   ? Cancer Father   ? ? ? ?Social History  ? ?Socioeconomic History  ? Marital status: Married  ?  Spouse name: Not on file  ? Number of children: Not on file  ? Years of education: Not on file  ? Highest education level: Not  on file  ?Occupational History  ? Not on file  ?Tobacco Use  ? Smoking status: Never  ? Smokeless tobacco: Never  ?Substance and Sexual Activity  ? Alcohol use: Yes  ?  Alcohol/week: 0.0 standard drinks  ? Drug use: No  ? Sexual activity: Not on file  ?Other Topics Concern  ? Not on file  ?Social History Narrative  ? Not on file  ? ?Social Determinants of Health  ? ?Financial Resource Strain: Not on file  ?Food Insecurity: Not on file  ?Transportation Needs: Not on file  ?Physical Activity: Not on file  ?Stress: Not on file  ?Social Connections: Not on file  ?Intimate Partner Violence: Not on file  ? ? ? ?BP 136/84   Pulse 80   Ht 6\' 4"  (1.93 m)   Wt 210 lb 3.2 oz (95.3 kg)   SpO2 98%   BMI 25.59 kg/m?  ? ?Physical Exam: ? ?Well appearing NAD ?HEENT: Unremarkable ?Neck:  No JVD, no thyromegally ?Lymphatics:  No adenopathy ?Back:  No CVA tenderness ?Lungs:  Clear ?HEART:  Regular rate rhythm, no murmurs, no rubs, no clicks ?Abd:  soft, positive bowel sounds, no organomegally, no  rebound, no guarding ?Ext:  2 plus pulses, no edema, no cyanosis, no clubbing ?Skin:  No rashes no nodules ?Neuro:  CN II through XII intact, motor grossly intact ? ?EKG - atrial fib with a controlled VR ? ? ?Assess/Plan:  ?1. Atrial fib -his VR is well controlled. No change in meds. ?2. Ascending aneurysm - his aorta has not changed size in 7 years. We will recheck in 2 years. ?3. Dyslipidemia - he appears to be tolerating his statin therapy ?4. Coags - no bleeding on xarelto. We will follow ?  ?Sharlot Gowda Celsa Nordahl,MD ?

## 2022-05-08 ENCOUNTER — Ambulatory Visit: Payer: Medicare PPO | Admitting: Student

## 2022-05-27 NOTE — Progress Notes (Unsigned)
Cardiology Office Note Date:  05/28/2022  Patient ID:  Michael Sheppard, Michael Sheppard [birth year], MRN [id number] PCP:  Lorelei Pont, DO  Electrophysiologist: Dr. Ladona Ridgel    Chief Complaint:  annual visit  History of Present Illness: Michael Sheppard is a 72 y.o. male with history of AFib (permanent), thoracic Ao aneurysm, HLD  He saw Dr. Ladona Ridgel 04/04/21, repeat CT scan in 2/23 demonstrated no change in his aneurysm size (40 mm), he was feeling well, no symptoms of AFib No changes made, stable aneurysm, plnned for CT in 2 years  TODAY He feels well His L knee especially will let him know when he has done tto much, but tried to get a mile or so walk in most days No CP/back pain, SOB, DOE. He is rarely aware of his AFib, though if he misses his Toprol his HR does go up.he is aware of it. Infrequently lightheaded with standing No near syncope or syncope. No bleeding or signs of bleeding    Past Medical History:  Diagnosis Date   A-fib [facility])    Erectile dysfunction    H/O colonoscopy 06/2011    Past Surgical History:  Procedure Laterality Date   COLONOSCOPY  10/2006   POLYP    Current Outpatient Medications  Medication Sig Dispense Refill   acetaminophen (TYLENOL) 500 MG tablet Take 500 mg by mouth every 6 (six) hours as needed (for pain).     doxycycline (VIBRAMYCIN) 100 MG capsule Take 100 mg by mouth 2 (two) times daily as needed (for rosacea).     loratadine (CLARITIN) 10 MG tablet Take 10 mg by mouth daily.     metoprolol succinate (TOPROL-XL) 100 MG 24 hr tablet Take 100 mg by mouth daily.  3   Omega-3 Fatty Acids (FISH OIL) 1200 MG CAPS Take 1 capsule by mouth daily.     phenylephrine (SUDAFED PE) 10 MG TABS tablet Take 10 mg by mouth every 4 (four) hours as needed (sinus congestion).     rivaroxaban (XARELTO) 20 MG TABS tablet Xarelto 20 mg tablet  TK 1 T PO ONCE D WF     rosuvastatin (CRESTOR) 10 MG tablet TAKE 1 TABLET(10 MG) BY MOUTH DAILY 90 tablet 3   tadalafil (CIALIS) 5 MG  tablet Take 5 mg by mouth daily as needed for erectile dysfunction.     No current facility-administered medications for this visit.    Allergies:   Patient has no known allergies.   Social History:  The patient  reports that he has never smoked. He has never used smokeless tobacco. He reports current alcohol use. He reports that he does not use drugs.   Family History:  The patient's family history includes Cancer in his father; Diabetes in his father.  ROS:  Please see the history of present illness.    All other systems are reviewed and otherwise negative.   PHYSICAL EXAM:  VS:  BP 136/78   Pulse 77   Ht 6\' 4"  (1.93 m)   Wt 212 lb 12.8 oz (96.5 kg)   SpO2 98%   BMI 25.90 kg/m  BMI: Body mass index is 25.9 kg/m. Well nourished, well developed, in no acute distress HEENT: normocephalic, atraumatic Neck: no JVD, carotid bruits or masses Cardiac:  irreg-irreg; no significant murmurs, no rubs, or gallops Lungs:  CTA b/l, no wheezing, rhonchi or rales Abd: soft, nontender MS: no deformity or atrophy Ext: no edema Skin: warm and dry, no rash Neuro:  No gross deficits appreciated Psych:  euthymic mood, full affect    EKG:  Done today and reviewed by myself shows  AFib 77bpm, Q wave/inv T III only   02/09/21: CTa IMPRESSION: Stable appearance of borderline ascending thoracic aortic aneurysm measuring up to 4.0 cm. Recommend annual imaging followup by CTA or MRA. This recommendation follows 2010    01/18/20: c.MRI IMPRESSION: 1. Normal left ventricular size, thickness and systolic function (LVEF = 55%) with no regional wall motion abnormalities. 2. Normal right ventricular size, thickness and systolic function (LVEF = 51%) with no regional wall motion abnormalities. 3.  Mild left and right atrial dilatation 4. Mild mitral and trivial tricuspid regurgitation. Trivial aortic regurgitation. 5.  Dilated aortic root with maximum diameter 47 mm. 6. Ascending aorta is  aneurysmal measuring 40 mm. Repeat CTA/MRA in 1 year.   Recent Labs: No results found for requested labs within last 365 days.  No results found for requested labs within last 365 days.   CrCl cannot be calculated (Patient's most recent lab result is older than the maximum 21 days allowed.).   Wt Readings from Last 3 Encounters:  05/28/22 212 lb 12.8 oz (96.5 kg)  04/04/21 210 lb 3.2 oz (95.3 kg)  03/22/20 206 lb 12.8 oz (93.8 kg)     Other studies reviewed: Additional studies/records reviewed today include: summarized above  ASSESSMENT AND PLAN:  Permanent Afib CHA2DS2Vasc is one, on Xarelto,  appropriately dosed Largely asymptomatic, rates controlled Update labs  Thoracic Ao aneurysm Stable over several years Planned for bi annual CT angiograms > due 2025 Would like his BP a bit lower, he doesn't regulalry check at home,.but does have a cuff I asked him to do daily BP checks, will have him bring the readings and his cuff to a RN visit in 3-4 weeks, pending these readings may add on 2nd BP agent  3. HLD PMD manages this   Disposition: F/u with EP otherwise again in a year, sooner if needed  Current medicines are reviewed at length with the patient today.  The patient did not have any concerns regarding medicines.  Norma Fredrickson, PA-C 05/28/2022 2:14 PM     [facility] [address] [place] [address] [phone] (office)  [phone] (fax)

## 2022-05-28 ENCOUNTER — Encounter: Payer: Self-pay | Admitting: Physician Assistant

## 2022-05-28 ENCOUNTER — Ambulatory Visit: Payer: Medicare PPO | Attending: Student | Admitting: Physician Assistant

## 2022-05-28 VITALS — BP 136/78 | HR 77 | Ht 76.0 in | Wt 212.8 lb

## 2022-05-28 DIAGNOSIS — I4821 Permanent atrial fibrillation: Secondary | ICD-10-CM

## 2022-05-28 DIAGNOSIS — I712 Thoracic aortic aneurysm, without rupture, unspecified: Secondary | ICD-10-CM

## 2022-05-28 NOTE — Patient Instructions (Signed)
Medication Instructions:   Your physician recommends that you continue on your current medications as directed. Please refer to the Current Medication list given to you today.  *If you need a refill on your cardiac medications before your next appointment, please call your pharmacy*   Lab Work:  BMET AND CBC TODAY   If you have labs (blood work) drawn today and your tests are completely normal, you will receive your results only by: MyChart Message (if you have MyChart) OR A paper copy in the mail If you have any lab test that is abnormal or we need to change your treatment, we will call you to review the results.   Testing/Procedures: NONE ORDERED  TODAY    Follow-Up: At [facility], you and your health needs are our priority.  As part of our continuing mission to provide you with exceptional heart care, we have created designated Provider Care Teams.  These Care Teams include your primary Cardiologist (physician) and Advanced Practice Providers (APPs -  Physician Assistants and Nurse Practitioners) who all work together to provide you with the care you need, when you need it.  We recommend signing up for the patient portal called "MyChart".  Sign up information is provided on this After Visit Summary.  MyChart is used to connect with patients for Virtual Visits (Telemedicine).  Patients are able to view lab/test results, encounter notes, upcoming appointments, etc.  Non-urgent messages can be sent to your provider as well.   To learn more about what you can do with MyChart, go to [email or web address].    Your next appointment:  3-4 WEEKS NURSE VISIT BLOOD PRESSURE AND BRING HOME CUFF. 1 year(s)  Provider:   You may see Dr. Ladona Ridgel  or one of the following Advanced Practice Providers on your designated Care Team:   Francis Dowse, [place]   Other Instructions

## 2022-05-28 NOTE — Addendum Note (Signed)
Addended by: Oleta Mouse on: 05/28/2022 02:26 PM   Modules accepted: Orders

## 2022-05-29 LAB — CBC
Hematocrit: 50.4 % (ref 37.5–51.0)
Hemoglobin: 16.6 g/dL (ref 13.0–17.7)
MCH: 30.7 pg (ref 26.6–33.0)
MCHC: 32.9 g/dL (ref 31.5–35.7)
MCV: 93 fL (ref 79–97)
Platelets: 161 10*3/uL (ref 150–450)
RBC: 5.4 x10E6/uL (ref 4.14–5.80)
RDW: 13.1 % (ref 11.6–15.4)
WBC: 7.1 10*3/uL (ref 3.4–10.8)

## 2022-05-29 LAB — BASIC METABOLIC PANEL
BUN/Creatinine Ratio: 18 (ref 10–24)
BUN: 17 mg/dL (ref 8–27)
CO2: 25 mmol/L (ref 20–29)
Calcium: 10 mg/dL (ref 8.6–10.2)
Chloride: 105 mmol/L (ref 96–106)
Creatinine, Ser: 0.95 mg/dL (ref 0.76–1.27)
Glucose: 87 mg/dL (ref 70–99)
Potassium: 5 mmol/L (ref 3.5–5.2)
Sodium: 143 mmol/L (ref 134–144)
eGFR: 86 mL/min/{1.73_m2} (ref >59)

## 2022-06-20 ENCOUNTER — Ambulatory Visit: Payer: Medicare PPO | Attending: Cardiology | Admitting: *Deleted

## 2022-06-20 DIAGNOSIS — I4821 Permanent atrial fibrillation: Secondary | ICD-10-CM

## 2022-06-20 NOTE — Progress Notes (Signed)
   Nurse Visit   Date of Encounter: 06/20/2022 ID: Ennis Forts, DOB [birth year], MRN [id number]  PCP:  Lorelei Pont, DO   [facility] Providers Cardiologist:  Lewayne Bunting, MD      Visit Details   VS:  BP 124/68 (BP Location: Right Arm, Patient Position: Sitting, Cuff Size: Normal)   Wt 214 lb 3.2 oz (97.2 kg)   BMI 26.07 kg/m  , BMI Body mass index is 26.07 kg/m.  Wt Readings from Last 3 Encounters:  06/20/22 214 lb 3.2 oz (97.2 kg)  05/28/22 212 lb 12.8 oz (96.5 kg)  04/04/21 210 lb 3.2 oz (95.3 kg)     Reason for visit: blood pressure check Performed today: bp manually 124/68 (r), 128/78 (L)pt checked w home cuff - readings are not consistent w mine 130/103(r), 131/100 (r).  Home readings brought today from 05/29/22 - 06/20/22 range from 103-127 / 73-100.   He feels well.  Reviewed w Dr. Elberta Fortis.  No changes are recommended.  Pt aware to continue to monitor at home.  Call or report back if consistently getting readings >135 or >90. Length of Visit: 10 minutes    Medications Adjustments/Labs and Tests Ordered: No orders of the defined types were placed in this encounter.  No orders of the defined types were placed in this encounter.    Jerald Kief, RN  06/20/2022 11:30 AM

## 2023-02-04 LAB — COMPREHENSIVE METABOLIC PANEL WITH GFR: EGFR: 89

## 2023-04-30 ENCOUNTER — Telehealth: Payer: Self-pay | Admitting: Internal Medicine

## 2023-04-30 NOTE — Telephone Encounter (Signed)
 Pt called in to sch f/u for 05/24/23 with Davis, PA. He states he is supposed to have MRI or CT done. Please advise if something needs to be ordered.

## 2023-04-30 NOTE — Telephone Encounter (Signed)
 Spoke with pt regarding his upcoming appointment with Valeri Gate, PA-C. Pt stated he is due to have a CT scan as he is to have his ascending aneurysm checked every 2 years per Dr. Carolynne Citron. Pt was told this information would be forwarded to Valeri Gate, PA-C and his covering, as well as Dr. Carolynne Citron his nurse. Pt verbalized understanding. All questions if any were answered.

## 2023-04-30 NOTE — Telephone Encounter (Signed)
 Spoke with pt regarding the CTA. Pt stated he is ok with getting set up for the scan at his next appointment with Valeri Gate, PA-C. Pt verbalized understanding. All questions if any were answered.

## 2023-05-21 NOTE — Progress Notes (Unsigned)
  Electrophysiology Office Note:   Date:  05/24/2023  ID:  Michael Sheppard, DOB 1950-08-03, MRN [id number]  Primary Cardiologist: Manya Sells, MD Electrophysiologist: Manya Sells, MD      History of Present Illness:   Michael Sheppard is a 73 y.o. male with h/o permanent AF, thoracic Ao aneurysm, and HLD seen today for routine electrophysiology followup.   Since last being seen in our clinic the patient reports doing very well. Overall,  he denies chest pain, palpitations, dyspnea, PND, orthopnea, nausea, vomiting, dizziness, syncope, weight gain, or early satiety. Has mild peripheral edema at times.   Review of systems complete and found to be negative unless listed in HPI.   EP Information / Studies Reviewed:    EKG is ordered today. Personal review as below.  EKG Interpretation Date/Time:  Friday May 24 2023 09:22:45 EDT Ventricular Rate:  97 PR Interval:    QRS Duration:  132 QT Interval:  322 QTC Calculation: 408 R Axis:   39  Text Interpretation: Atrial fibrillation Non-specific intra-ventricular conduction block Cannot rule out Septal infarct , age undetermined No previous ECGs available Confirmed by Pilar Bridge [phone]) on 05/24/2023 9:25:17 AM    Arrhythmia/Device History No specialty comments available.   Physical Exam:   VS:  BP (!) 140/82 (Cuff Size: Normal)   Pulse 96   Ht 6\' 4"  (1.93 m)   Wt 211 lb (95.7 kg)   SpO2 97%   BMI 25.68 kg/m    Wt Readings from Last 3 Encounters:  05/24/23 211 lb (95.7 kg)  06/20/22 214 lb 3.2 oz (97.2 kg)  05/28/22 212 lb 12.8 oz (96.5 kg)     GEN: No acute distress NECK: No JVD; No carotid bruits CARDIAC: Irregularly irregular rate and rhythm, no murmurs, rubs, gallops RESPIRATORY:  Clear to auscultation without rales, wheezing or rhonchi  ABDOMEN: Soft, non-tender, non-distended EXTREMITIES:  No edema; No deformity   ASSESSMENT AND PLAN:    Permanent AF Rates overall well controlled Continue Xarelto for CHA2DS2/VASc of at  least 2 Update Labs  Thoracic Ao aneurysm  Bi annual CT angiograms - Will order  HTN Stable on current regimen   HLD Continue statin   Reviewed with Dr. Marven Slimmer and given no current EP needs, will transition his care to General Cardiology on his return in 12 months.   EP will be available as needed should other issues arise.   Signed, Tylene Galla, PA-C

## 2023-05-24 ENCOUNTER — Ambulatory Visit: Attending: Student | Admitting: Student

## 2023-05-24 ENCOUNTER — Encounter: Payer: Self-pay | Admitting: Student

## 2023-05-24 VITALS — BP 140/82 | HR 96 | Ht 76.0 in | Wt 211.0 lb

## 2023-05-24 DIAGNOSIS — I10 Essential (primary) hypertension: Secondary | ICD-10-CM

## 2023-05-24 DIAGNOSIS — I4821 Permanent atrial fibrillation: Secondary | ICD-10-CM

## 2023-05-24 DIAGNOSIS — I712 Thoracic aortic aneurysm, without rupture, unspecified: Secondary | ICD-10-CM | POA: Diagnosis not present

## 2023-05-24 NOTE — Patient Instructions (Signed)
 Medication Instructions:  No medications changes today   *If you need a refill on your cardiac medications before your next appointment, please call your pharmacy*  Lab Work: No lab work today If you have labs (blood work) drawn today and your tests are completely normal, you will receive your results only by: MyChart Message (if you have MyChart) OR A paper copy in the mail If you have any lab test that is abnormal or we need to change your treatment, we will call you to review the results.  Testing/Procedures: A CT scan was ordered today for chronic follow up of your Aorta. You will be called to schedule this.   Follow-Up: At [facility], you and your health needs are our priority.  As part of our continuing mission to provide you with exceptional heart care, our providers are all part of one team.  This team includes your primary Cardiologist (physician) and Advanced Practice Providers or APPs (Physician Assistants and Nurse Practitioners) who all work together to provide you with the care you need, when you need it.  Your next appointment:   12 month(s)  Provider:   We will transition your care to our General Cardiology team given Dr. Meridith Stanford upcoming retirement  We recommend signing up for the patient portal called "MyChart".  Sign up information is provided on this After Visit Summary.  MyChart is used to connect with patients for Virtual Visits (Telemedicine).  Patients are able to view lab/test results, encounter notes, upcoming appointments, etc.  Non-urgent messages can be sent to your provider as well.   To learn more about what you can do with MyChart, go to [email or web address].

## 2023-07-25 ENCOUNTER — Ambulatory Visit (HOSPITAL_COMMUNITY)
Admission: RE | Admit: 2023-07-25 | Discharge: 2023-07-25 | Disposition: A | Discharge status: Home / Self Care | Source: Ambulatory Visit | Attending: Student | Admitting: Student

## 2023-07-25 DIAGNOSIS — I712 Thoracic aortic aneurysm, without rupture, unspecified: Secondary | ICD-10-CM | POA: Diagnosis present

## 2023-07-25 MED ORDER — IOHEXOL 350 MG/ML SOLN
75.0000 mL | Freq: Once | INTRAVENOUS | Status: AC | PRN
  Administered 2023-07-25: 75 mL via INTRAVENOUS

## 2023-07-30 ENCOUNTER — Ambulatory Visit: Payer: Self-pay | Admitting: Student
